# Patient Record
Sex: Female | Born: 1975 | Race: Black or African American | Hispanic: No | Smoking: Former smoker
Health system: Southern US, Community
[De-identification: ages and names within clinical notes are randomized; demographics above are authoritative.]

## PROBLEM LIST (undated history)

## (undated) HISTORY — PX: GASTRIC BYPASS: SHX52

## (undated) HISTORY — PX: DILATION AND CURETTAGE OF UTERUS: SHX78

---

## 1999-09-08 ENCOUNTER — Emergency Department (HOSPITAL_COMMUNITY): Admission: EM | Admit: 1999-09-08 | Discharge: 1999-09-08 | Payer: Self-pay | Admitting: Emergency Medicine

## 1999-09-11 ENCOUNTER — Emergency Department (HOSPITAL_COMMUNITY): Admission: EM | Admit: 1999-09-11 | Discharge: 1999-09-11 | Payer: Self-pay | Admitting: Internal Medicine

## 2002-08-18 ENCOUNTER — Emergency Department (HOSPITAL_COMMUNITY): Admission: EM | Admit: 2002-08-18 | Discharge: 2002-08-18 | Payer: Self-pay | Admitting: Emergency Medicine

## 2003-02-06 ENCOUNTER — Emergency Department (HOSPITAL_COMMUNITY): Admission: EM | Admit: 2003-02-06 | Discharge: 2003-02-06 | Payer: Self-pay | Admitting: Emergency Medicine

## 2003-06-02 ENCOUNTER — Other Ambulatory Visit: Admission: RE | Admit: 2003-06-02 | Discharge: 2003-06-02 | Payer: Self-pay | Admitting: Obstetrics and Gynecology

## 2003-06-08 ENCOUNTER — Encounter: Admission: RE | Admit: 2003-06-08 | Discharge: 2003-06-08 | Payer: Self-pay | Admitting: Family Medicine

## 2004-06-02 ENCOUNTER — Emergency Department (HOSPITAL_COMMUNITY): Admission: EM | Admit: 2004-06-02 | Discharge: 2004-06-02 | Payer: Self-pay | Admitting: Emergency Medicine

## 2004-06-29 ENCOUNTER — Emergency Department (HOSPITAL_COMMUNITY): Admission: EM | Admit: 2004-06-29 | Discharge: 2004-06-29 | Payer: Self-pay | Admitting: Emergency Medicine

## 2004-10-12 ENCOUNTER — Emergency Department (HOSPITAL_COMMUNITY): Admission: EM | Admit: 2004-10-12 | Discharge: 2004-10-13 | Payer: Self-pay | Admitting: Emergency Medicine

## 2005-02-09 ENCOUNTER — Emergency Department (HOSPITAL_COMMUNITY): Admission: EM | Admit: 2005-02-09 | Discharge: 2005-02-09 | Payer: Self-pay | Admitting: Emergency Medicine

## 2005-05-19 ENCOUNTER — Emergency Department (HOSPITAL_COMMUNITY): Admission: EM | Admit: 2005-05-19 | Discharge: 2005-05-19 | Payer: Self-pay | Admitting: Emergency Medicine

## 2005-06-15 ENCOUNTER — Emergency Department (HOSPITAL_COMMUNITY): Admission: EM | Admit: 2005-06-15 | Discharge: 2005-06-15 | Payer: Self-pay | Admitting: Emergency Medicine

## 2005-07-22 IMAGING — CR DG SACRUM/COCCYX 2+V
2 series · 2 of 2 positions shown · non-contrast
Comparison: none

CLINICAL DATA: Fall.  Pain. 
 SACRUM AND COCCYX 
 There is no evidence of fracture, focal lesions, or other significant bone abnormality.

 IMPRESSION
 Normal study.
 RIGHT HIP COMPLETE 
  There is no evidence of fracture or dislocation. No other significant bone or soft tissue abnormalities are identified. The joint spaces are within normal limits.

[view not recorded (1 of 2)]
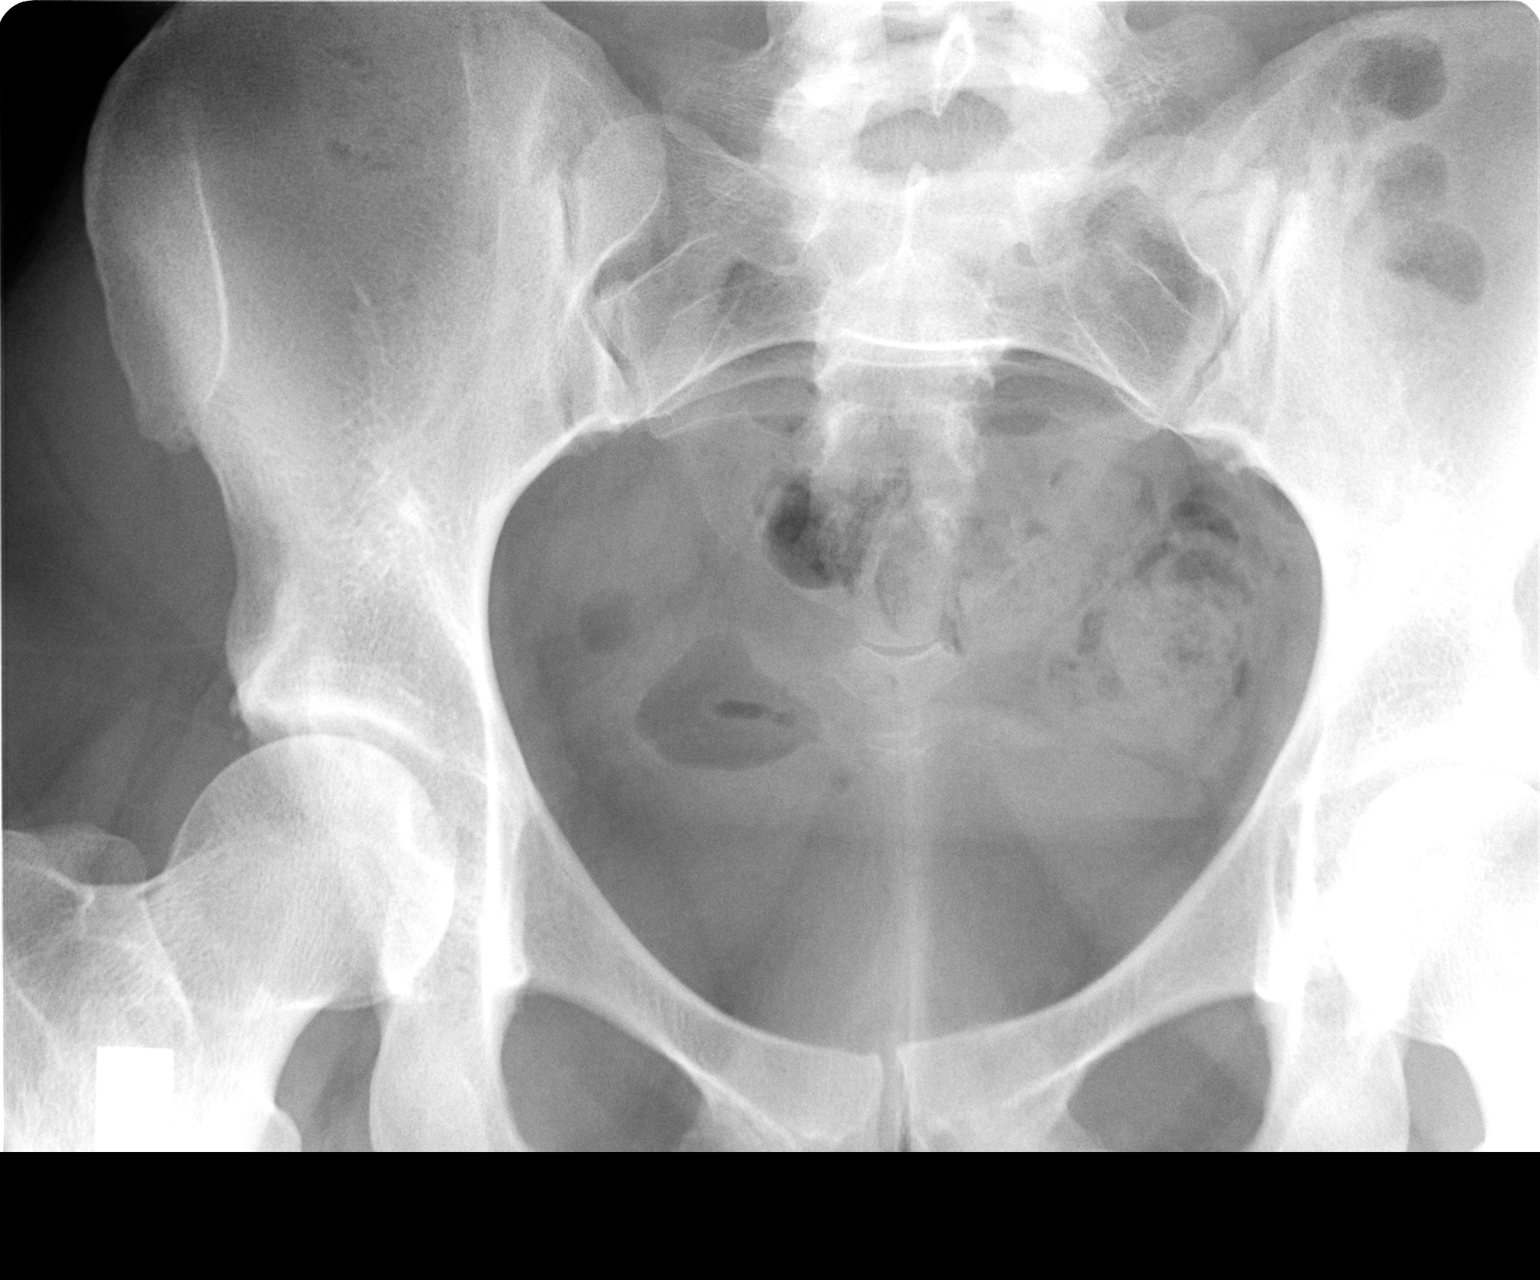

[view not recorded (2 of 2)]
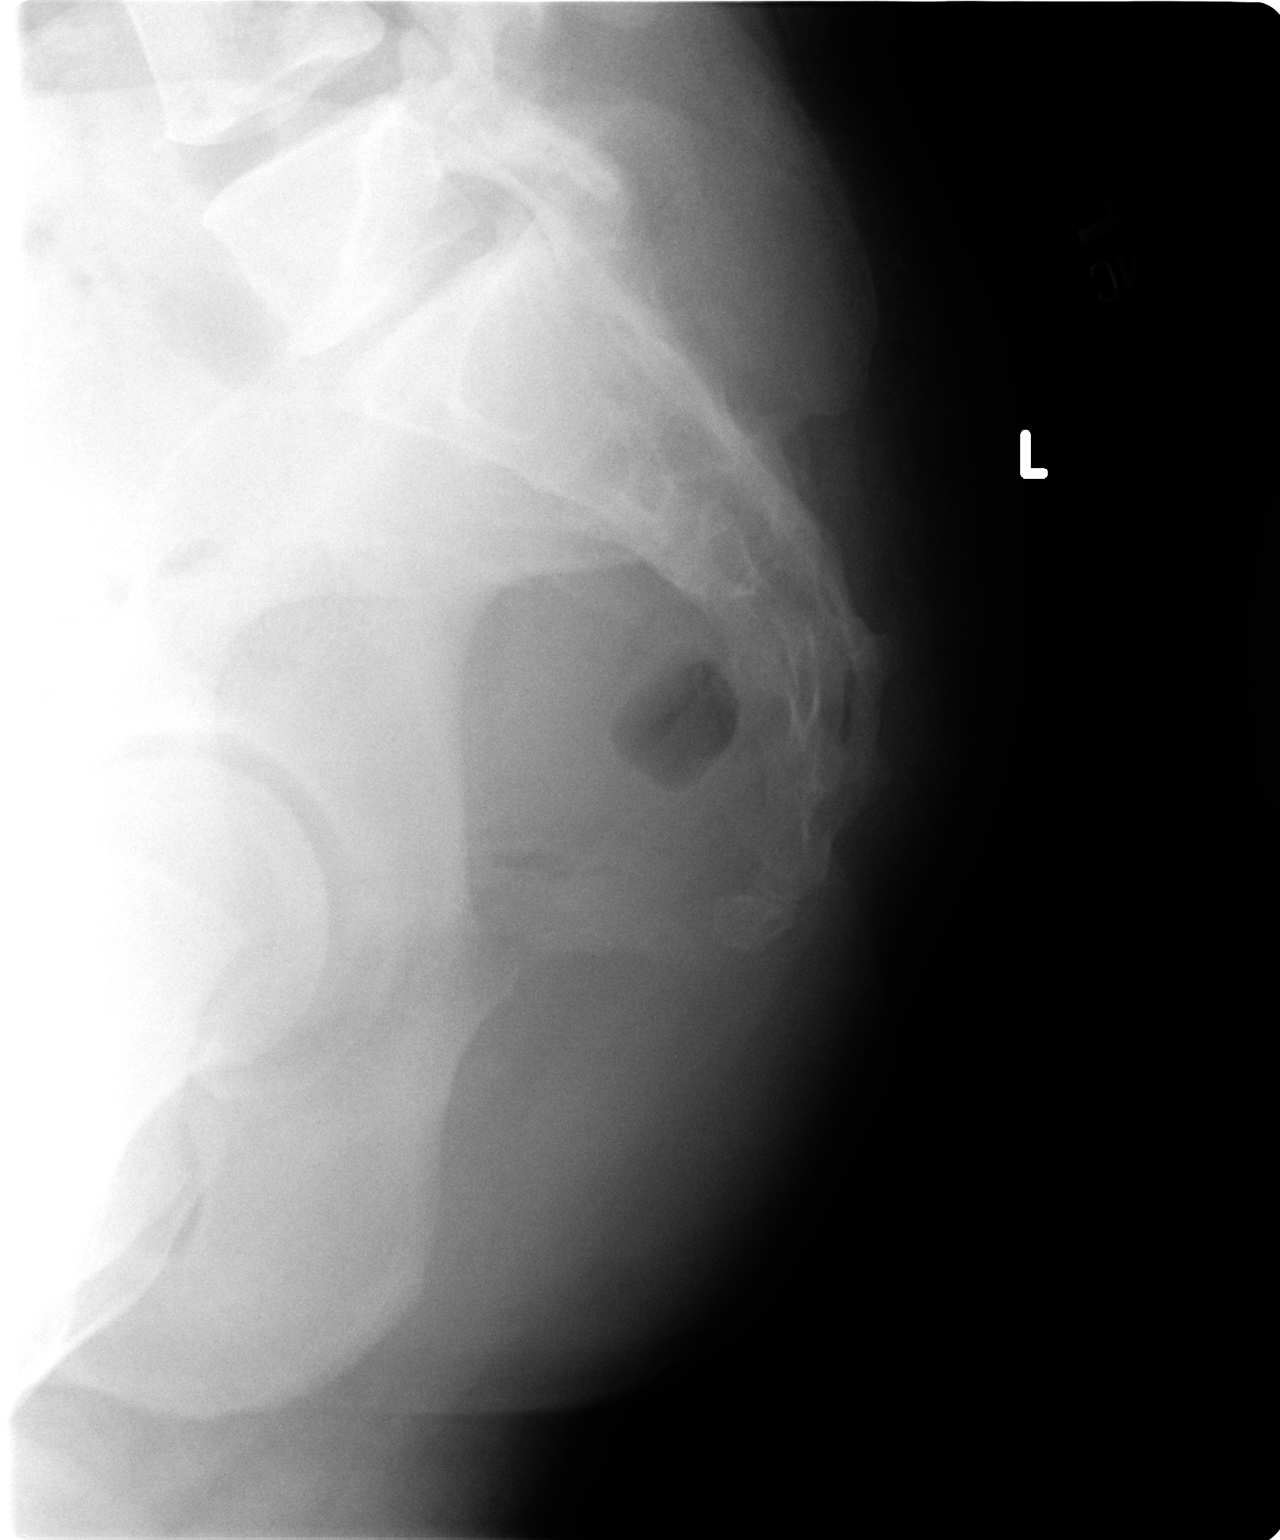

[2 of 2 positions shown; findings below may reference images not displayed]

## 2005-07-22 IMAGING — CR DG HIP COMPLETE 2+V*R*
3 series · 3 of 3 positions shown · non-contrast
Comparison: none

CLINICAL DATA: Fall.  Pain. 
 SACRUM AND COCCYX 
 There is no evidence of fracture, focal lesions, or other significant bone abnormality.

 IMPRESSION
 Normal study.
 RIGHT HIP COMPLETE 
  There is no evidence of fracture or dislocation. No other significant bone or soft tissue abnormalities are identified. The joint spaces are within normal limits.

[view not recorded (1 of 3)]
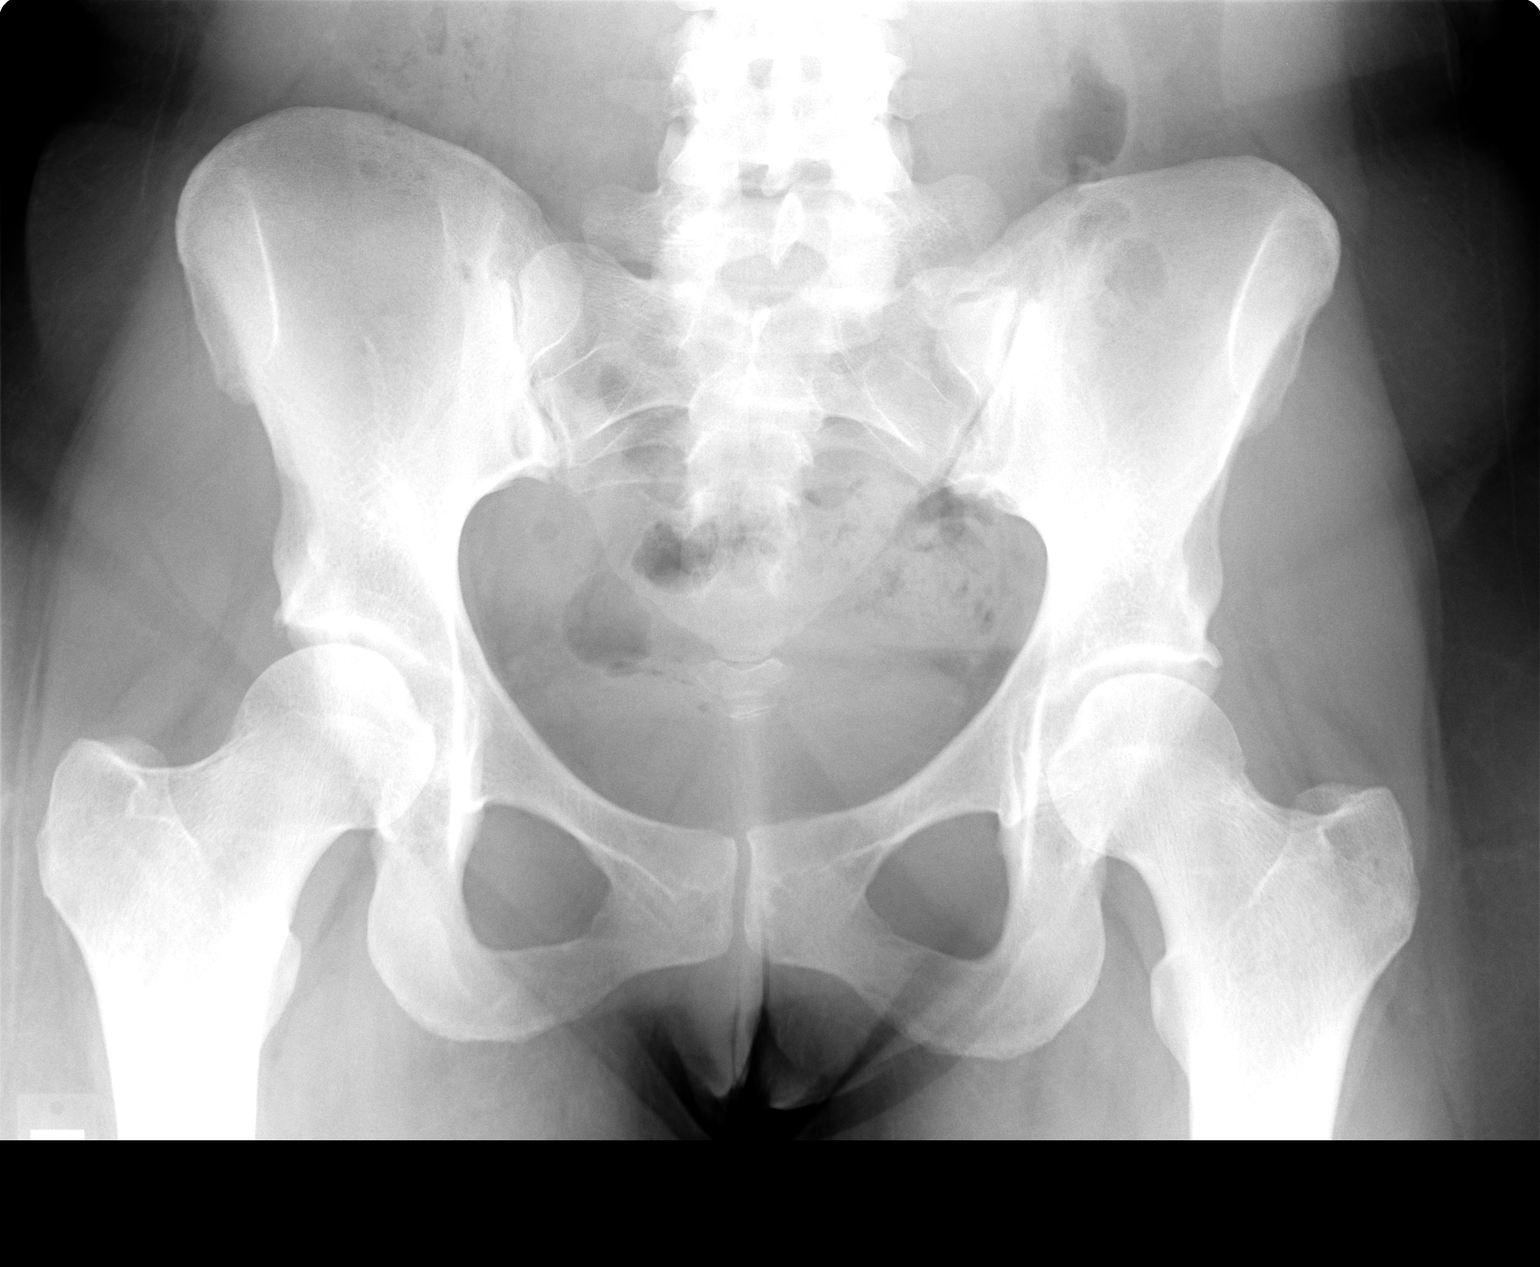

[view not recorded (2 of 3)]
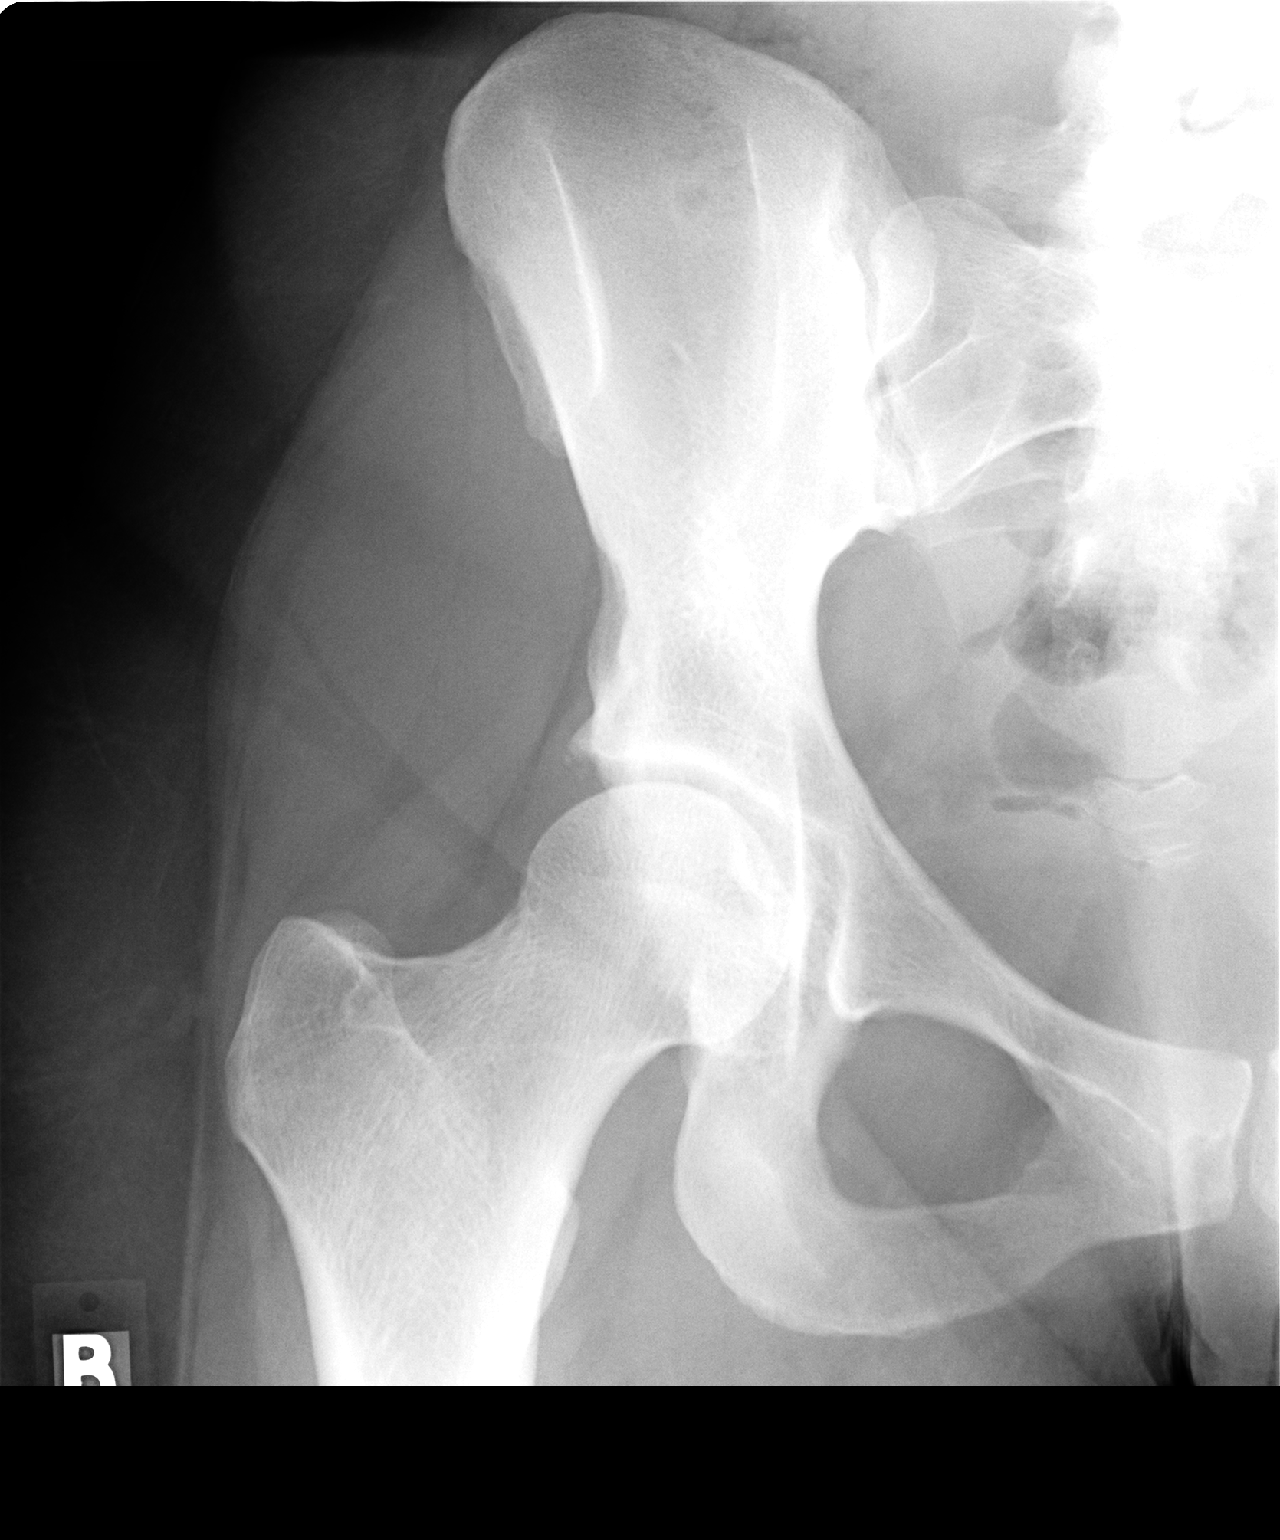

[view not recorded (3 of 3)]
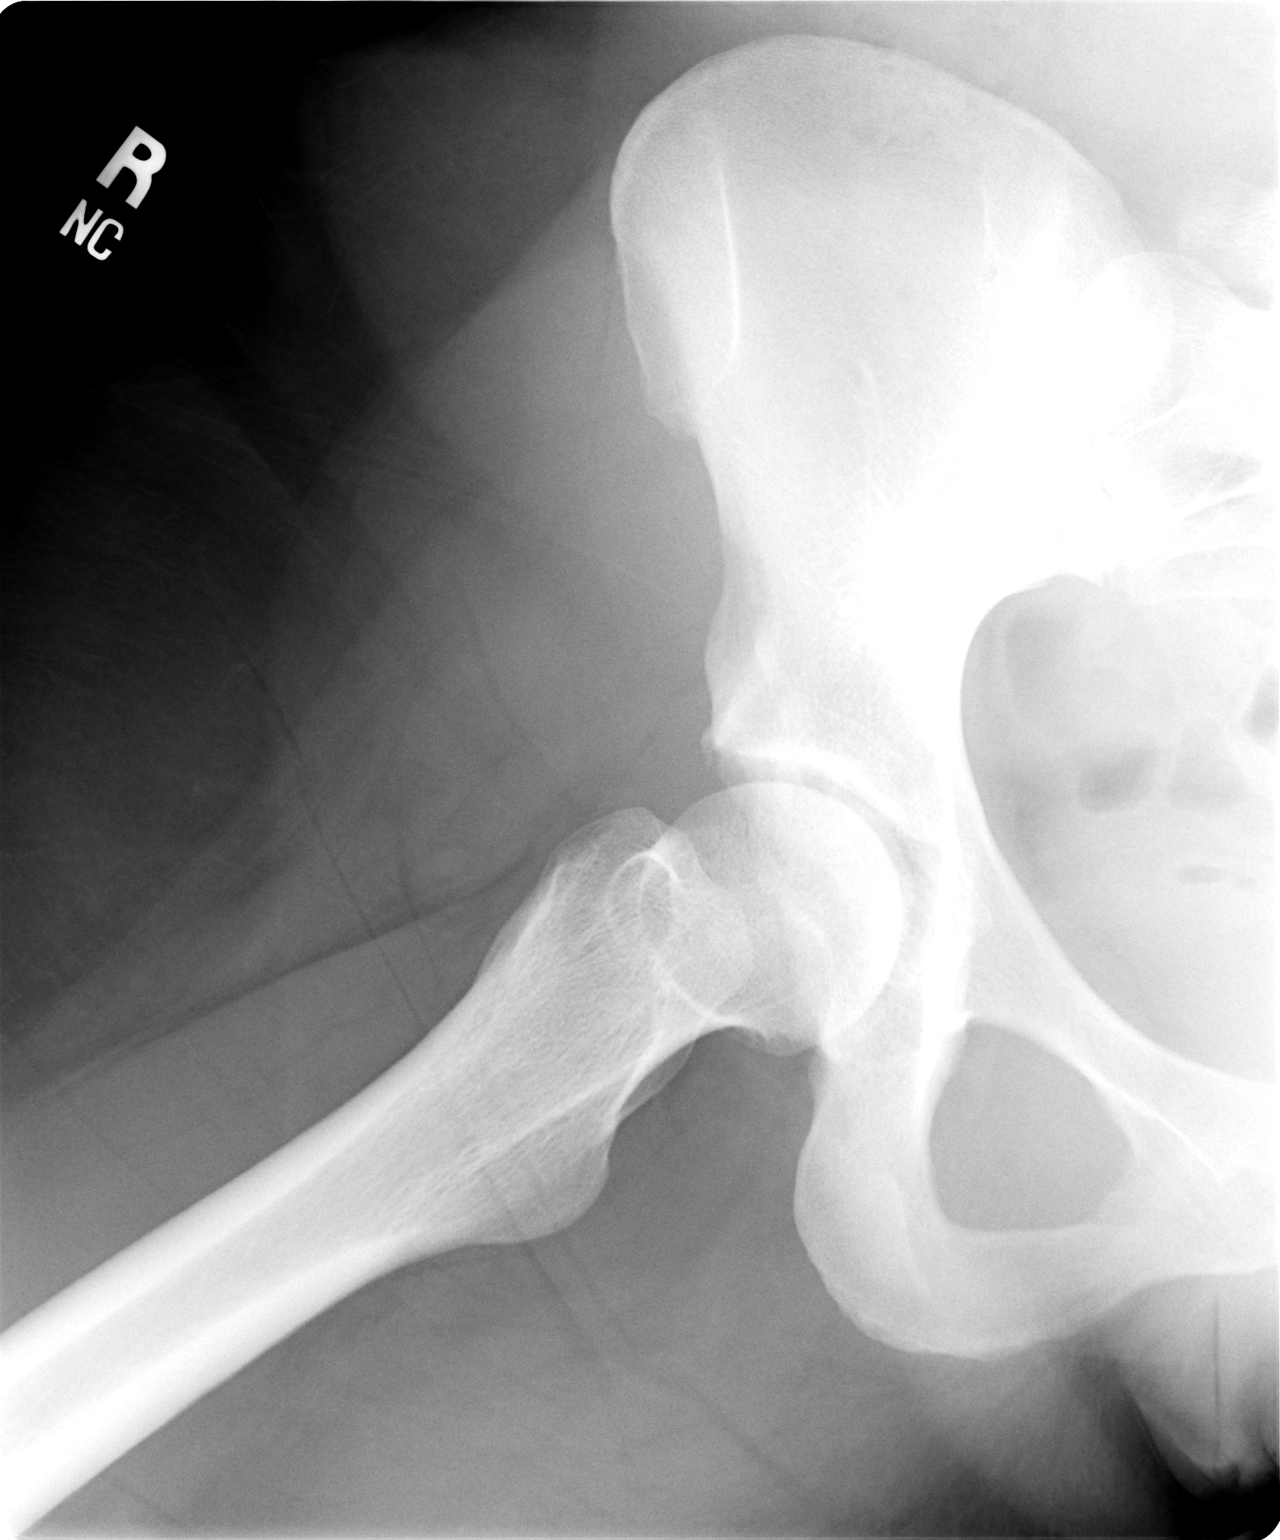

[3 of 3 positions shown; findings below may reference images not displayed]

## 2016-03-10 ENCOUNTER — Encounter (HOSPITAL_COMMUNITY): Payer: Self-pay | Admitting: *Deleted

## 2016-03-10 ENCOUNTER — Ambulatory Visit (HOSPITAL_COMMUNITY)
Admission: EM | Admit: 2016-03-10 | Discharge: 2016-03-10 | Disposition: A | Discharge status: Home / Self Care | Payer: BLUE CROSS/BLUE SHIELD | Attending: Family Medicine | Admitting: Family Medicine

## 2016-03-10 DIAGNOSIS — R0789 Other chest pain: Secondary | ICD-10-CM | POA: Diagnosis not present

## 2016-03-10 NOTE — Discharge Instructions (Signed)
Your heart and lung sounds are normal. There is no bony abnormality that would account for this. Therefore, I believe that you have strained your chest muscles with attached near the sternum. Resolution of this problem often takes at least a week. You can take Tylenol for comfort if you wish but in general the problem heals without any medication.

## 2016-03-10 NOTE — ED Provider Notes (Signed)
[facility]    CSN: [id number] Arrival date & time: 03/10/16  1544     History   Chief Complaint Chief Complaint  Patient presents with  . Chest Pain    HPI Michelle Bender is a 41 y.o. female.   C/O sudden onset mid chest pain while turning over in bed during night 2 nights ago.  Pain has remained constant and is aggravated by deep breathing and, on one occasion, reaching up high.  Denies any cough or cold sxs.  Skin warm and dry.  Has not taken any measures to help alleviate sxs.  Has a h/o GERD, but this has resolved subsequent to bariatric surgery.  Yesterday, the pain was associated with a panic attack and the pain was worse.    About a week ago, patient aspirated a piece of corn.  She is worried if corn piece is still there.  Patient is also spending time taking care of her grandmother in helping her move.      History reviewed. No pertinent past medical history.  There are no active problems to display for this patient.   Past Surgical History:  Procedure Laterality Date  . CESAREAN SECTION    . DILATION AND CURETTAGE OF UTERUS    . GASTRIC BYPASS      OB History    No data available       Home Medications    Prior to Admission medications   Medication Sig Start Date End Date Taking? Authorizing Provider  MULTIPLE VITAMINS PO Take by mouth.   Yes Historical Provider, MD    Family History No family history on file.  Social History Social History  Substance Use Topics  . Smoking status: Former Games developermoker  . Smokeless tobacco: Never Used  . Alcohol use No     Allergies   Reglan [metoclopramide]   Review of Systems Review of Systems  Constitutional: Negative.   HENT: Negative.   Respiratory: Positive for chest tightness.   Cardiovascular: Positive for chest pain.  Gastrointestinal: Negative.      Physical Exam Triage Vital Signs ED Triage Vitals  Enc Vitals Group     BP 03/10/16 1607 114/74     Pulse Rate 03/10/16 1607  79     Resp 03/10/16 1607 16     Temp 03/10/16 1607 98.1 F (36.7 C)     Temp Source 03/10/16 1607 Oral     SpO2 03/10/16 1607 100 %     Weight --      Height --      Head Circumference --      Peak Flow --      Pain Score 03/10/16 1613 4     Pain Loc --      Pain Edu? --      Excl. in GC? --    No data found.   Updated Vital Signs BP 114/74   Pulse 79   Temp 98.1 F (36.7 C) (Oral)   Resp 16   LMP 03/07/2016 (Exact Date)   SpO2 100%    Physical Exam  Constitutional: She is oriented to person, place, and time. She appears well-developed and well-nourished.  Eyes: Conjunctivae and EOM are normal.  Neck: Normal range of motion. Neck supple.  Cardiovascular: Normal rate, regular rhythm and normal heart sounds.   Pulmonary/Chest: Effort normal and breath sounds normal.  Mild chest tenderness to left of sternum  Musculoskeletal: Normal range of motion.  Neurological: She is alert and oriented to  person, place, and time.  Skin: Skin is warm and dry.  Nursing note and vitals reviewed.    UC Treatments / Results  Labs (all labs ordered are listed, but only abnormal results are displayed) Labs Reviewed - No data to display  EKG  EKG Interpretation None       Radiology No results found.  Procedures Procedures (including critical care time)  Medications Ordered in UC Medications - No data to display   Initial Impression / Assessment and Plan / UC Course  I have reviewed the triage vital signs and the nursing notes.  Pertinent labs & imaging results that were available during my care of the patient were reviewed by me and considered in my medical decision making (see chart for details).     Final Clinical Impressions(s) / UC Diagnoses   Final diagnoses:  Chest wall pain    New Prescriptions New Prescriptions   No medications on file     Elvina Sidle, MD 03/10/16 1644

## 2016-03-10 NOTE — ED Triage Notes (Signed)
C/O sudden onset mid chest pain while turning over in bed during night 2 nights ago.  Pain has remained constant and is aggravated by deep breathing and reaching up high.  Denies any cough or cold sxs.  Skin warm and dry.  Has not taken any measures to help alleviate sxs.
